# Patient Record
Sex: Male | Born: 1970 | Race: White | Hispanic: No | Marital: Married | Smoking: Never smoker
Health system: Southern US, Community
[De-identification: ages and names within clinical notes are randomized; demographics above are authoritative.]

## PROBLEM LIST (undated history)

## (undated) DIAGNOSIS — S0300XA Dislocation of jaw, unspecified side, initial encounter: Secondary | ICD-10-CM

## (undated) HISTORY — DX: Dislocation of jaw, unspecified side, initial encounter: S03.00XA

## (undated) HISTORY — PX: CHOLECYSTECTOMY: SHX55

---

## 2000-07-25 ENCOUNTER — Encounter: Payer: Self-pay | Admitting: Emergency Medicine

## 2000-07-25 ENCOUNTER — Emergency Department (HOSPITAL_COMMUNITY): Admission: EM | Admit: 2000-07-25 | Discharge: 2000-07-25 | Payer: Self-pay

## 2013-03-22 ENCOUNTER — Ambulatory Visit (INDEPENDENT_AMBULATORY_CARE_PROVIDER_SITE_OTHER): Payer: BC Managed Care – PPO | Admitting: Family Medicine

## 2013-03-22 ENCOUNTER — Ambulatory Visit: Payer: BC Managed Care – PPO

## 2013-03-22 VITALS — BP 139/92 | HR 89 | Temp 98.2°F | Resp 18 | Ht 68.0 in | Wt 324.0 lb

## 2013-03-22 DIAGNOSIS — M25569 Pain in unspecified knee: Secondary | ICD-10-CM

## 2013-03-22 DIAGNOSIS — M25562 Pain in left knee: Secondary | ICD-10-CM

## 2013-03-22 DIAGNOSIS — T148XXA Other injury of unspecified body region, initial encounter: Secondary | ICD-10-CM

## 2013-03-22 MED ORDER — METHOCARBAMOL 500 MG PO TABS: 500.0000 mg | ORAL_TABLET | Freq: Every evening | ORAL | Status: DC | PRN

## 2013-03-22 MED ORDER — OXAPROZIN 600 MG PO TABS: 600.0000 mg | ORAL_TABLET | Freq: Two times a day (BID) | ORAL | Status: DC

## 2013-03-22 MED ORDER — TRAMADOL HCL 50 MG PO TABS: 50.0000 mg | ORAL_TABLET | Freq: Three times a day (TID) | ORAL | Status: DC | PRN

## 2013-03-22 NOTE — Progress Notes (Signed)
[facility]:  Office Visit  Chief Complaint:  Chief Complaint  Patient presents with  . Knee Pain    left knee pain and lower leg-felt pain when mowing yesterday    HPI: Henry Friedman is a 42 y.o. male who complains of  Left knee pain, this actually started  2-3 weeks ago while pulling brush at work. Had sharp pain and hurt the 1st day and hurt a little the next day, then it resolved. However he mowed the lawn yesterday and had sharp left knee pain intermittently, worse with walkign and turning in certain positions. He may have been pivoting on his left leg when he was moving and pushing the lawn mower when this started.Throbbing and pressure pain with prolong sitting, better with ROM . Has left medial pain and pain along inferior knee. He also has sharp pain that shoots up from the medial side up to his anterior knee. Has had no prior knee injuries. He had a chainsaw accident several years ago which was stitched up. Denies weakness, numbness, tingling.   Past Medical History  Diagnosis Date  . TMJ (dislocation of temporomandibular joint)    History reviewed. No pertinent past surgical history. History   Social History  . Marital Status: Married    Spouse Name: N/A    Number of Children: N/A  . Years of Education: N/A   Social History Main Topics  . Smoking status: Never Smoker   . Smokeless tobacco: None  . Alcohol Use: Yes  . Drug Use: No  . Sexually Active: Yes   Other Topics Concern  . None   Social History Narrative  . None   Family History  Problem Relation Age of Onset  . Cancer Maternal Grandfather     lung   No Known Allergies Prior to Admission medications   Not on File     ROS: The patient denies fevers, chills, night sweats, unintentional weight loss, chest pain, palpitations, wheezing, dyspnea on exertion, nausea, vomiting, abdominal pain, dysuria, hematuria, melena, numbness, weakness, or tingling.   All other systems have been  reviewed and were otherwise negative with the exception of those mentioned in the HPI and as above.    PHYSICAL EXAM: Filed Vitals:   03/22/13 1552  BP: 139/92  Pulse: 89  Temp: 98.2 F (36.8 C)  Resp: 18   Filed Vitals:   03/22/13 1552  Height: 5\' 8"  (1.727 m)  Weight: 324 lb (146.965 kg)   Body mass index is 49.28 kg/(m^2).  General: Alert, no acute distress,morbidly obese white male HEENT:  Normocephalic, atraumatic, oropharynx patent.  Cardiovascular:  Regular rate and rhythm, no rubs murmurs or gallops.  No Carotid bruits, radial pulse intact. +pedal edema.  Respiratory: Clear to auscultation bilaterally.  No wheezes, rales, or rhonchi.  No cyanosis, no use of accessory musculature GI: No organomegaly, abdomen is soft and non-tender, positive bowel sounds.  No masses. Skin: No rashes. Neurologic: Facial musculature symmetric. Psychiatric: Patient is appropriate throughout our interaction. Lymphatic: No cervical lymphadenopathy Musculoskeletal: Limping on left knee due to pain. No deformities Old scar from chainsaw + dark spot where foreign body on xray might be located + crepitus,  Minimal effusion No erythema, warmth + joint line tenderness, + pain at [facility] with Valgus stress, stable to varus stress, no appreciable Lachmans Full ROM, 5/5 strength, sensation intact    LABS: No results found for this or any previous visit.   EKG/XRAY:   Primary read interpreted by Dr.  Ori Kreiter at [facility]. Foreign body in left lateral upper knee ( not where his pain is--has had prior knee injury with chainsaw) No acute fx/dislocation + minimal DJD   ASSESSMENT/PLAN: Encounter Diagnoses  Name Primary?  . Acute knee pain, left Yes  . Sprain and strain    Likely MC ligamentous sprain with possible medial meniscus involvement of left knee Patient was given rx for knee brace to be obtained at a [employer] or [facility] since they take insurance.  We were unable to fit him  with a brace at our office due to his body habitus He will determine if he is able to afford the brace Rx Daypro with precautions given, Robaxin and also Tramadol F/u prn    Aruna Nestler PHUONG, DO 03/22/2013 5:07 PM

## 2015-03-10 ENCOUNTER — Ambulatory Visit (INDEPENDENT_AMBULATORY_CARE_PROVIDER_SITE_OTHER): Payer: BLUE CROSS/BLUE SHIELD

## 2015-03-10 ENCOUNTER — Ambulatory Visit (INDEPENDENT_AMBULATORY_CARE_PROVIDER_SITE_OTHER): Payer: BLUE CROSS/BLUE SHIELD | Admitting: Physician Assistant

## 2015-03-10 VITALS — BP 118/80 | HR 107 | Temp 98.1°F | Resp 18 | Ht 68.0 in | Wt 319.0 lb

## 2015-03-10 DIAGNOSIS — I891 Lymphangitis: Secondary | ICD-10-CM

## 2015-03-10 DIAGNOSIS — M7989 Other specified soft tissue disorders: Secondary | ICD-10-CM | POA: Diagnosis not present

## 2015-03-10 LAB — POCT CBC
Granulocyte percent: 69.8 %G (ref 37–80)
HCT, POC: 43.4 % — AB (ref 43.5–53.7)
Hemoglobin: 14 g/dL — AB (ref 14.1–18.1)
Lymph, poc: 2.7 (ref 0.6–3.4)
MCH, POC: 27.6 pg (ref 27–31.2)
MCHC: 92.4 g/dL — AB (ref 31.8–35.4)
MCV: 85.3 fL (ref 80–97)
MID (cbc): 0.8 (ref 0–0.9)
MPV: 6.7 fL (ref 0–99.8)
POC Granulocyte: 8.2 — AB (ref 2–6.9)
POC LYMPH PERCENT: 23.1 %L (ref 10–50)
POC MID %: 7.1 %M (ref 0–12)
Platelet Count, POC: 339 10*3/uL (ref 142–424)
RBC: 5.09 M/uL (ref 4.69–6.13)
RDW, POC: 13.3 %
WBC: 11.7 10*3/uL — AB (ref 4.6–10.2)

## 2015-03-10 LAB — GLUCOSE, POCT (MANUAL RESULT ENTRY): POC Glucose: 101 mg/dl — AB (ref 70–99)

## 2015-03-10 MED ORDER — CEPHALEXIN 500 MG PO CAPS: 500.0000 mg | ORAL_CAPSULE | Freq: Two times a day (BID) | ORAL | Status: AC

## 2015-03-10 NOTE — Patient Instructions (Signed)
Take antibiotic twice a day for 10 days. Rest your right hand the best you can until starting to improve. Take benadryl at night and zyrtec in the morning to help your allergic symptoms. Return in 2 days for follow up.

## 2015-03-10 NOTE — Progress Notes (Signed)
Subjective:    Patient ID: Henry Friedman, male    DOB: [birth year], 44 y.o.   MRN: [id number]  HPI  This is a 44 year old male who is presenting with right hand swelling x 1 day. Reports yesterday he woke and his right 5th digit was itchy. States he scratched that finger a couple times yesterday. This morning he woke scratching his right 5th digits. Immediately after he started to noticed swelling of the medial aspect of his hand. The swelling gradually started spreading up his forearm. Everything is itchy. He has mild pain at his medial wrist. He cannot recall getting an insect bite or being being in contact with outside brush. He went to [place], [place] this weekend but did not do anything out of the ordinary. He took 800 mg ibuprofen this morning without help. He denies fever or chills. He has no known allergies other than poison ivy which he states he is extremely allergic to and this is nothing like an allergic reaction to poison ivy.  Review of Systems  Constitutional: Negative for fever and chills.  Respiratory: Negative for wheezing.   Gastrointestinal: Negative for nausea, vomiting and abdominal pain.  Musculoskeletal: Positive for arthralgias.  Skin: Positive for color change and rash.  Allergic/Immunologic: Negative for environmental allergies.  Neurological: Negative for numbness.  Hematological: Negative for adenopathy.    There are no active problems to display for this patient.  Prior to Admission medications   Medication Sig Start Date End Date Taking? Authorizing Provider  levothyroxine (SYNTHROID, LEVOTHROID) 50 MCG tablet Take 50 mcg by mouth daily before breakfast.   Yes Historical Provider, MD   No Known Allergies  Patient's social and family history were reviewed.     Objective:   Physical Exam  Constitutional: He is oriented to person, place, and time. He appears well-developed and well-nourished. No distress.  HENT:  Head: Normocephalic and atraumatic.    Right Ear: Hearing normal.  Left Ear: Hearing normal.  Nose: Nose normal.  Eyes: Conjunctivae and lids are normal. Right eye exhibits no discharge. Left eye exhibits no discharge. No scleral icterus.  Cardiovascular: Normal rate, regular rhythm, normal heart sounds and normal pulses.   No murmur heard. Pulmonary/Chest: Effort normal and breath sounds normal. No respiratory distress. He has no wheezes. He has no rhonchi. He has no rales.  Musculoskeletal: Normal range of motion.  Neurological: He is alert and oriented to person, place, and time. He has normal strength. No sensory deficit.  Skin: Skin is warm and dry.  Right medial hand/wrist with erythema and swelling. Lymphangitic spread present over ventral forearm. Decreased ROM in hand and wrist d/t swelling. Tenderness only at proximal 5th metacarpal/medial wrist.  Psychiatric: He has a normal mood and affect. His speech is normal and behavior is normal. Thought content normal.   BP 118/80 mmHg  Pulse 107  Temp(Src) 98.1 F (36.7 C) (Oral)  Resp 18  Ht  (1.727 m)  Wt 319 lb (144.697 kg)  BMI 48.51 kg/m2  SpO2 97%  Results for orders placed or performed in visit on 03/10/15  POCT CBC  Result Value Ref Range   WBC 11.7 (A) 4.6 - 10.2 K/uL   Lymph, poc 2.7 0.6 - 3.4   POC LYMPH PERCENT 23.1 10 - 50 %L   MID (cbc) 0.8 0 - 0.9   POC MID % 7.1 0 - 12 %M   POC Granulocyte 8.2 (A) 2 - 6.9   Granulocyte percent 69.8 37 -  80 %G   RBC 5.09 4.69 - 6.13 M/uL   Hemoglobin 14.0 (A) 14.1 - 18.1 g/dL   HCT, POC 16.143.4 (A) 09.643.5 - 53.7 %   MCV 85.3 80 - 97 fL   MCH, POC 27.6 27 - 31.2 pg   MCHC 92.4 (A) 31.8 - 35.4 g/dL   RDW, POC 04.513.3 %   Platelet Count, POC 339 142 - 424 K/uL   MPV 6.7 0 - 99.8 fL  POCT glucose (manual entry)  Result Value Ref Range   POC Glucose 101 (A) 70 - 99 mg/dl   [facility] reading (PRIMARY) by  Dr. Merla Richesoolittle: negative     Assessment & Plan:  1. Swelling of right hand 2. Lymphangitis Radiographs negative.  D/t lymphangitic spread will treat with keflex. For allergic symptoms he will take benadryl at night and zyrtec during the day for the next 2-3 days. He will return for follow up in 2 days to make sure he is heading in the right direction.  - Uric Acid - POCT CBC - POCT glucose (manual entry) - DG Hand Complete Right; Future - DG Wrist Complete Right; Future - cephALEXin (KEFLEX) 500 MG capsule; Take 1 capsule (500 mg total) by mouth 2 (two) times daily.  Dispense: 20 capsule; Refill: 0   Roswell MinersNicole V. Dyke BrackettBush, PA-C, MHS [facility]  03/11/2015

## 2015-03-11 LAB — URIC ACID: Uric Acid, Serum: 7.8 mg/dL (ref 4.0–7.8)

## 2015-03-12 ENCOUNTER — Ambulatory Visit (INDEPENDENT_AMBULATORY_CARE_PROVIDER_SITE_OTHER): Payer: BLUE CROSS/BLUE SHIELD | Admitting: Physician Assistant

## 2015-03-12 VITALS — BP 120/84 | HR 104 | Temp 97.9°F | Resp 16 | Ht 68.0 in | Wt 319.0 lb

## 2015-03-12 DIAGNOSIS — M7989 Other specified soft tissue disorders: Secondary | ICD-10-CM

## 2015-03-12 DIAGNOSIS — I891 Lymphangitis: Secondary | ICD-10-CM | POA: Diagnosis not present

## 2015-03-12 DIAGNOSIS — E039 Hypothyroidism, unspecified: Secondary | ICD-10-CM | POA: Insufficient documentation

## 2015-03-12 NOTE — Progress Notes (Signed)
   Subjective:    Patient ID: Henry Friedman, male    DOB: 10/26/1971, 44 y.o.   MRN: [id number]  HPI  This is a 44 year old male who is presenting for follow up right hand swelling and lymphangitis. I saw pt 2 days ago with medial hand swelling and lymphangitis of volar forearm. He had itching and mild pain. Radiographs of his wrist and hand were negative. WBC was 11.7 with left shift. He was placed on keflex for strep coverage and instructed to take benadryl at night and claritin in the AM for allergic symptoms. Today he is doing much better. Swelling has almost completely resolved and no longer has signs of lymphangitis. No longer has pruritus. He denies fever or chills.  Review of Systems  Constitutional: Negative for fever and chills.  Gastrointestinal: Negative for nausea and vomiting.  Musculoskeletal: Negative for arthralgias.  Skin: Positive for color change. Negative for rash.  Neurological: Negative for numbness.  Hematological: Negative for adenopathy.   Patient Active Problem List   Diagnosis Date Noted  . Hypothyroidism 03/12/2015   Prior to Admission medications   Medication Sig Start Date End Date Taking? Authorizing Provider  cephALEXin (KEFLEX) 500 MG capsule Take 1 capsule (500 mg total) by mouth 2 (two) times daily. 03/10/15 03/20/15 Yes Lanier ClamNicole Bush V, PA-C  diphenhydrAMINE (BENADRYL) 25 MG tablet Take 25 mg by mouth every 6 (six) hours as needed.   Yes Historical Provider, MD  levothyroxine (SYNTHROID, LEVOTHROID) 50 MCG tablet Take 50 mcg by mouth daily before breakfast.   Yes Historical Provider, MD  loratadine (CLARITIN) 10 MG tablet Take 10 mg by mouth daily.   Yes Historical Provider, MD   No Known Allergies  Patient's social and family history were reviewed.     Objective:   Physical Exam  Constitutional: He is oriented to person, place, and time. He appears well-developed and well-nourished. No distress.  HENT:  Head: Normocephalic and atraumatic.  Right  Ear: Hearing normal.  Left Ear: Hearing normal.  Nose: Nose normal.  Eyes: Conjunctivae and lids are normal. Right eye exhibits no discharge. Left eye exhibits no discharge. No scleral icterus.  Pulmonary/Chest: Effort normal. No respiratory distress.  Musculoskeletal: Normal range of motion.  Neurological: He is alert and oriented to person, place, and time.  Skin: Skin is warm, dry and intact.  Mild erythema at medial wrist. Swelling has almost completely resolved. No signs of lymphangitis.   Psychiatric: He has a normal mood and affect. His speech is normal and behavior is normal. Thought content normal.   BP 120/84 mmHg  Pulse 104  Temp(Src) 97.9 F (36.6 C) (Oral)  Resp 16  Ht 5\' 8"  (1.727 m)  Wt 319 lb (144.697 kg)  BMI 48.51 kg/m2  SpO2 97%     Assessment & Plan:  1. Lymphangitis 2. Swelling of right hand Significant improvement on keflex. He may stop benadryl at night but would continue claritin daily for next week. He will return if symptoms worsen.   Roswell MinersNicole V. Dyke BrackettBush, PA-C, MHS [facility]  03/12/2015

## 2016-01-14 IMAGING — CR DG HAND COMPLETE 3+V*R*
3 series · 3 of 3 positions shown · non-contrast
Comparison: None.

CLINICAL DATA: Swelling of right hand.

EXAM:
RIGHT HAND - COMPLETE 3+ VIEW

[PA]
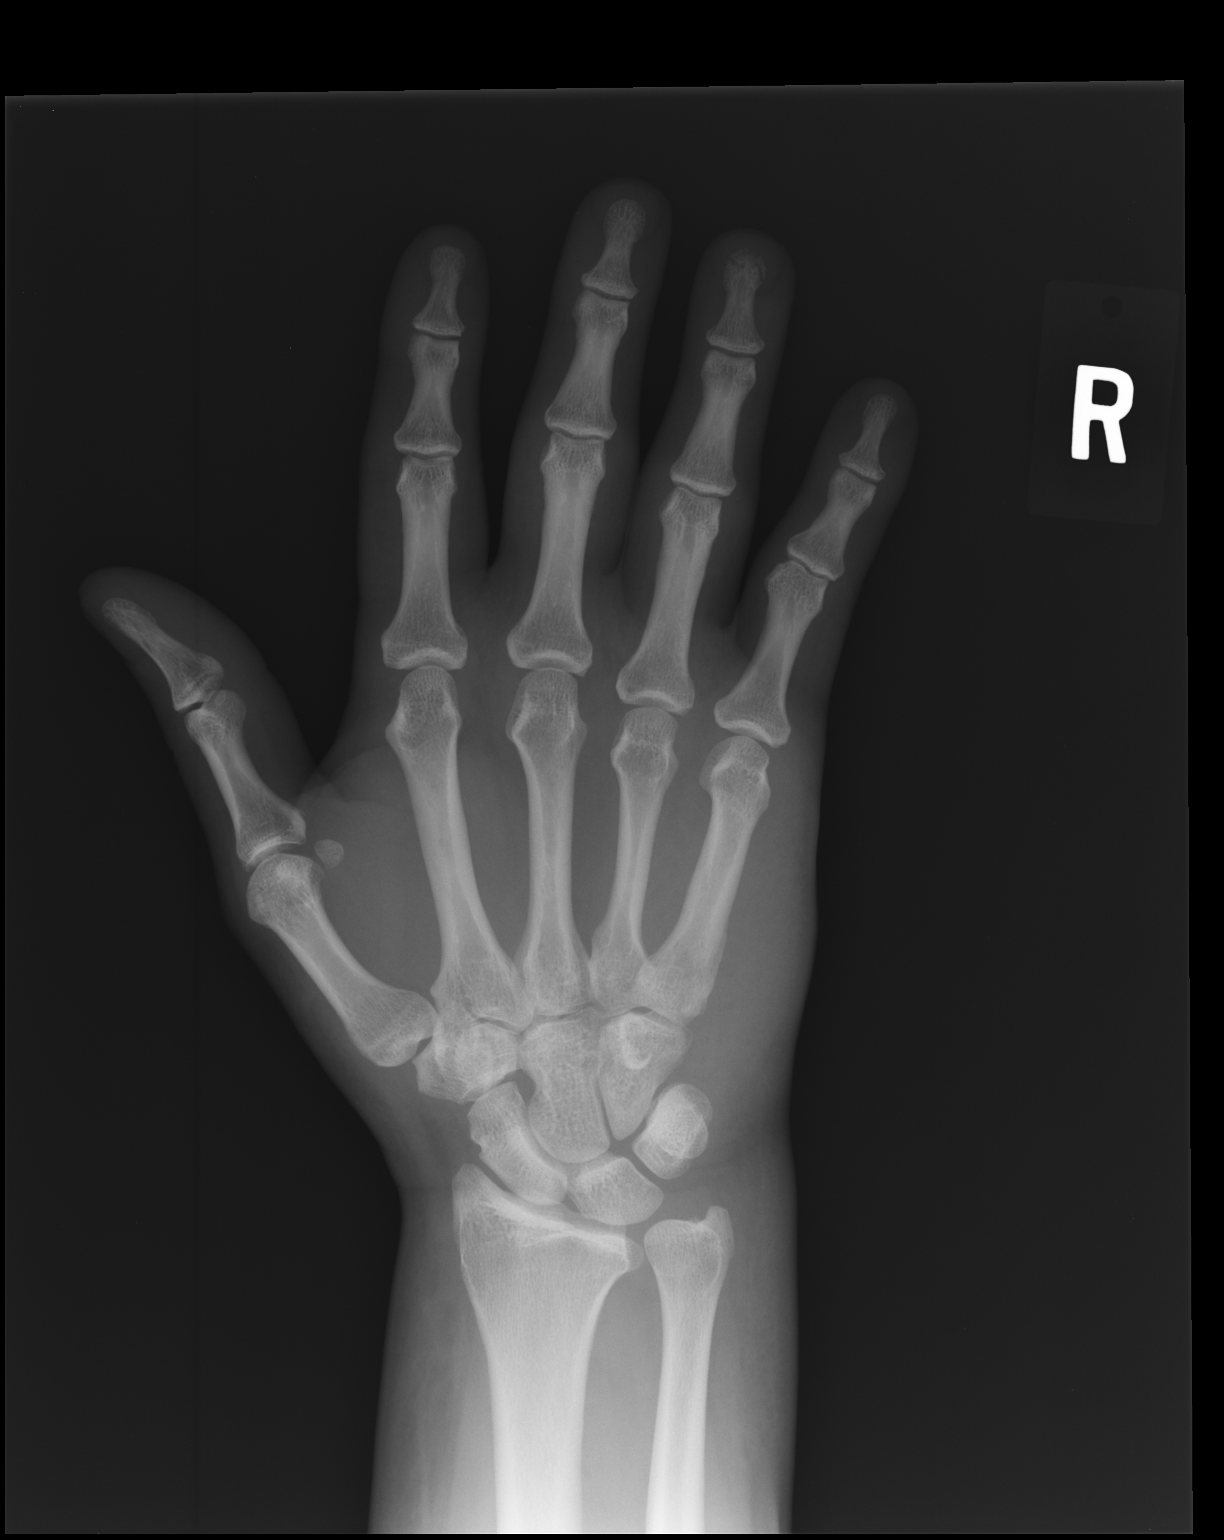

[lateral]
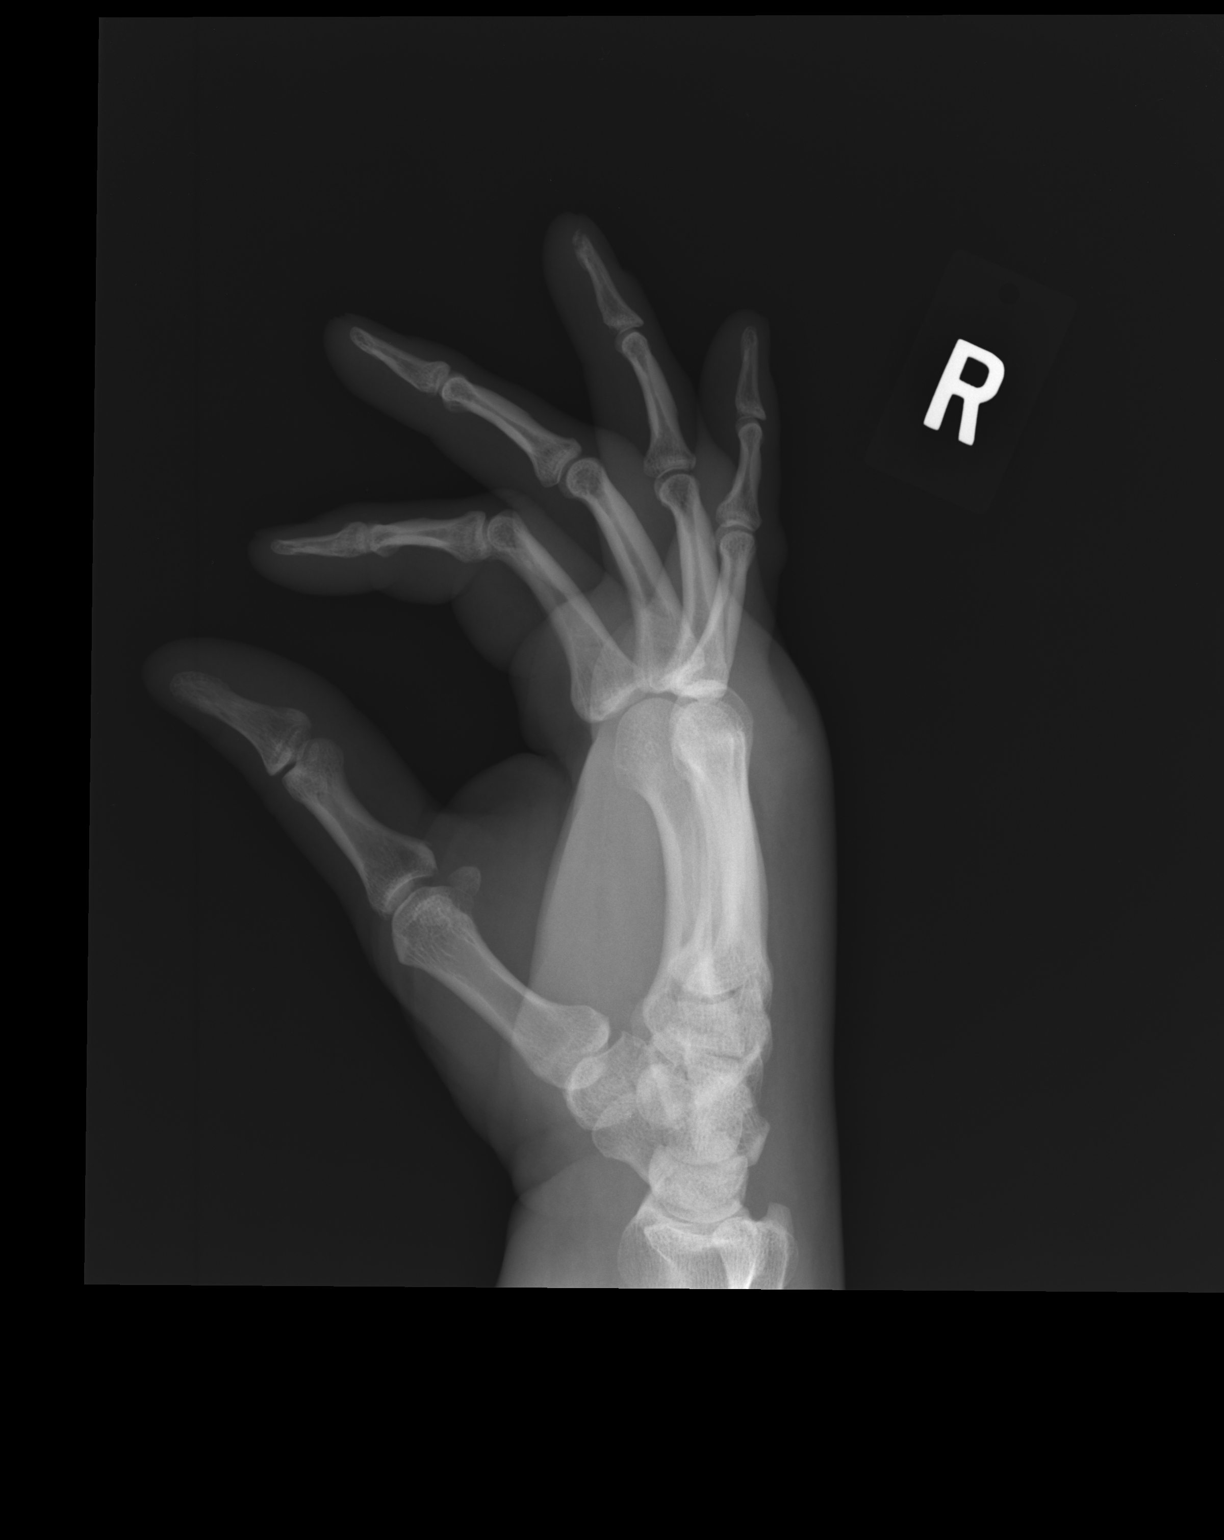

[pa obl]
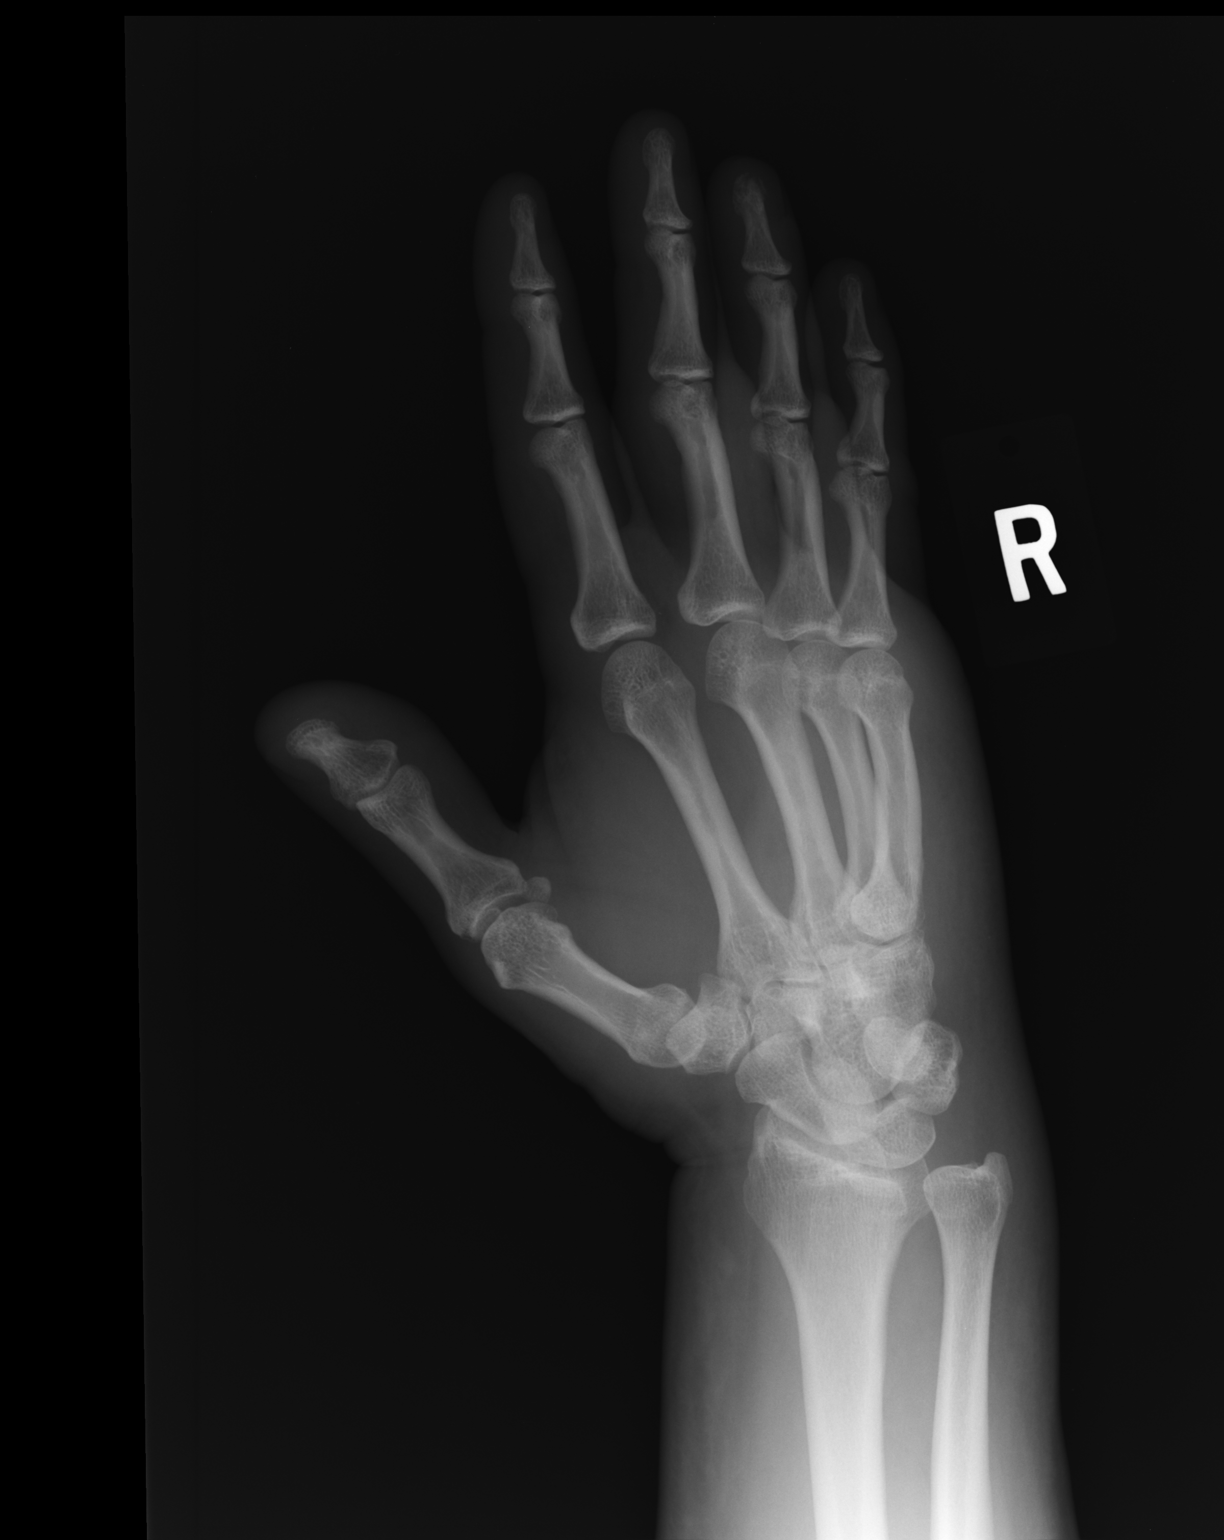

[3 of 3 positions shown; findings below may reference images not displayed]

FINDINGS: Soft tissue swelling over the metacarpal and MCP region. No
underlying bony abnormality. No fracture, subluxation or
dislocation. Joint spaces are maintained.
IMPRESSION: No acute bony abnormality.

## 2016-01-14 IMAGING — CR DG WRIST COMPLETE 3+V*R*
4 series · 4 of 4 positions shown · non-contrast
Comparison: Hand films, dictated separately.

CLINICAL DATA: Right hand swelling.

EXAM:
RIGHT WRIST - COMPLETE 3+ VIEW

[PA]
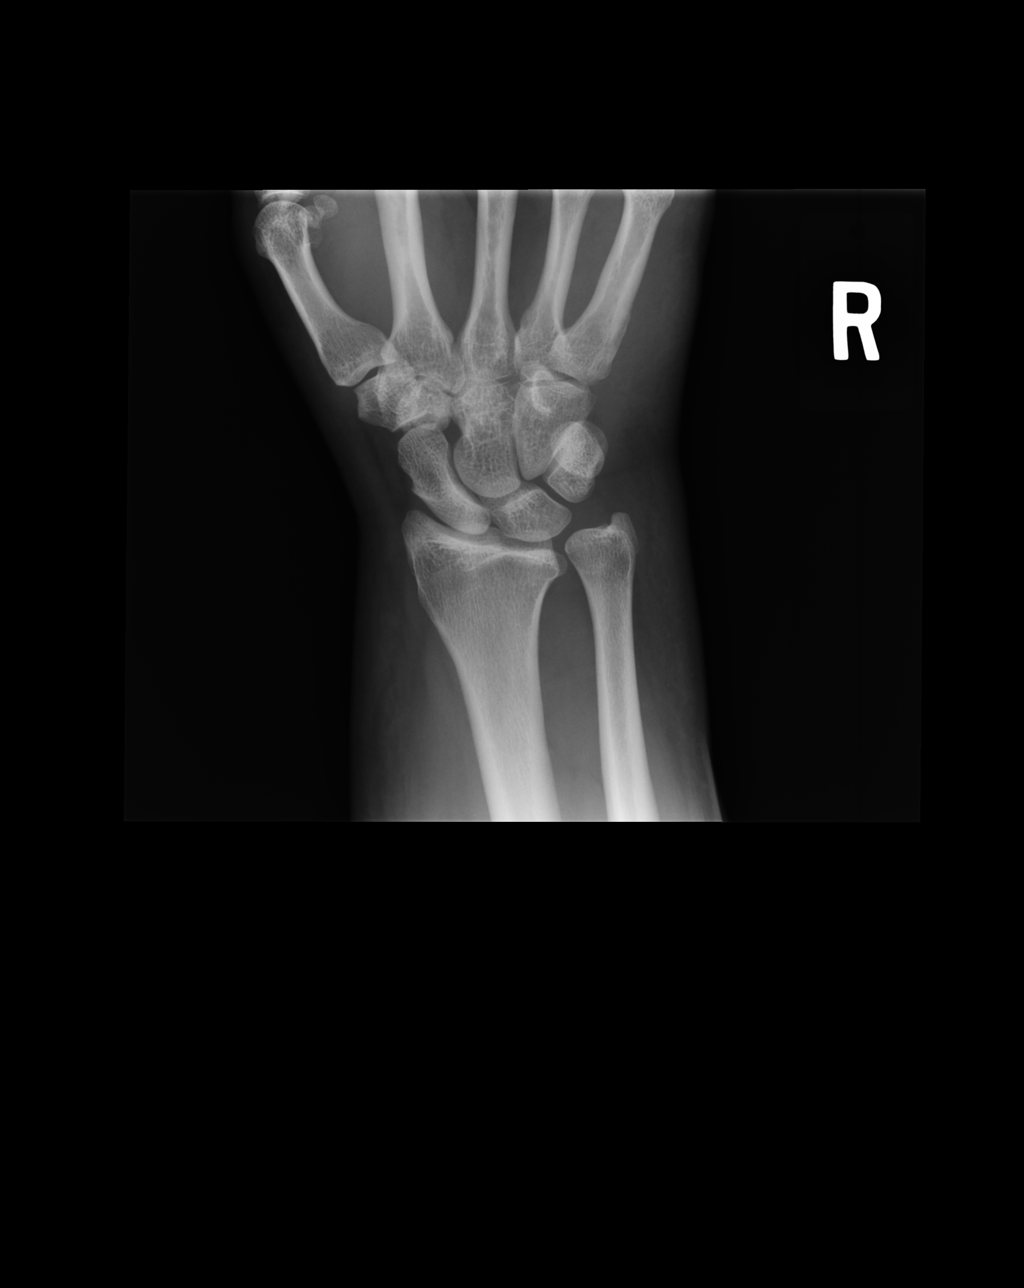

[pa int rot]
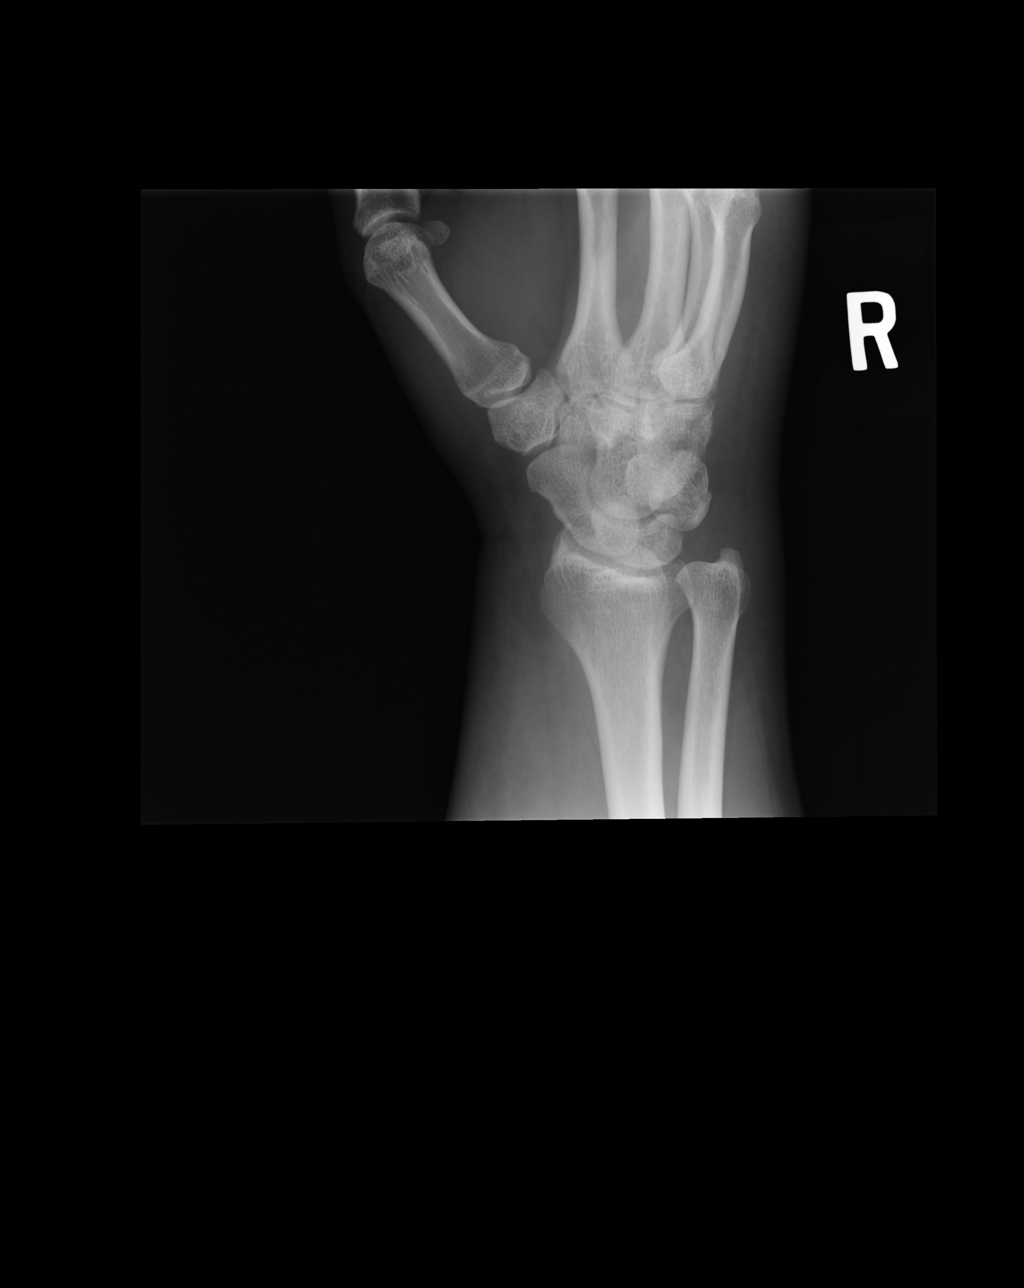

[lateral]
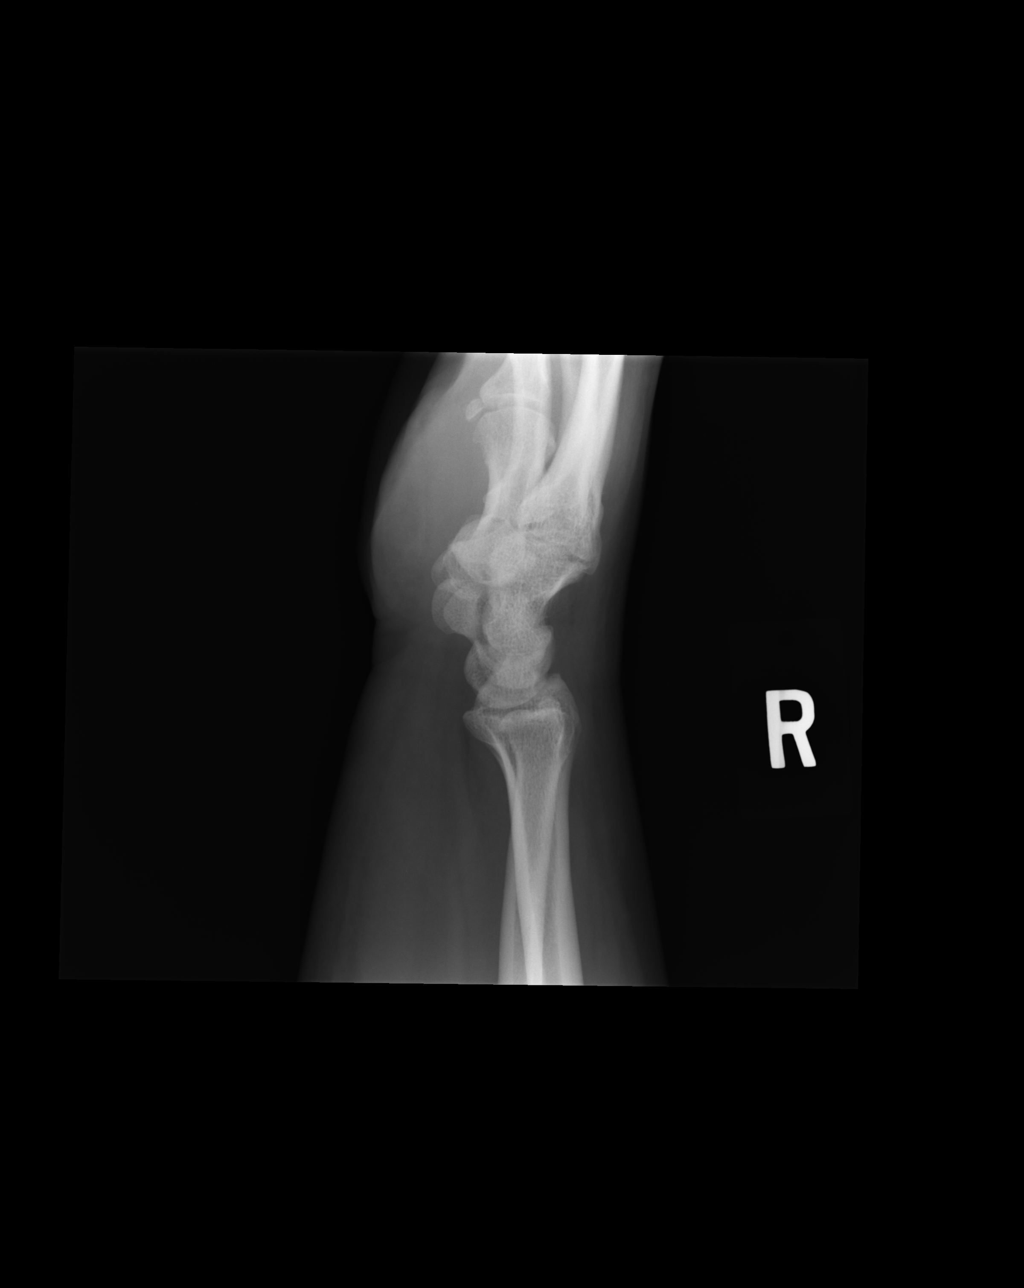

[ap ext rot]
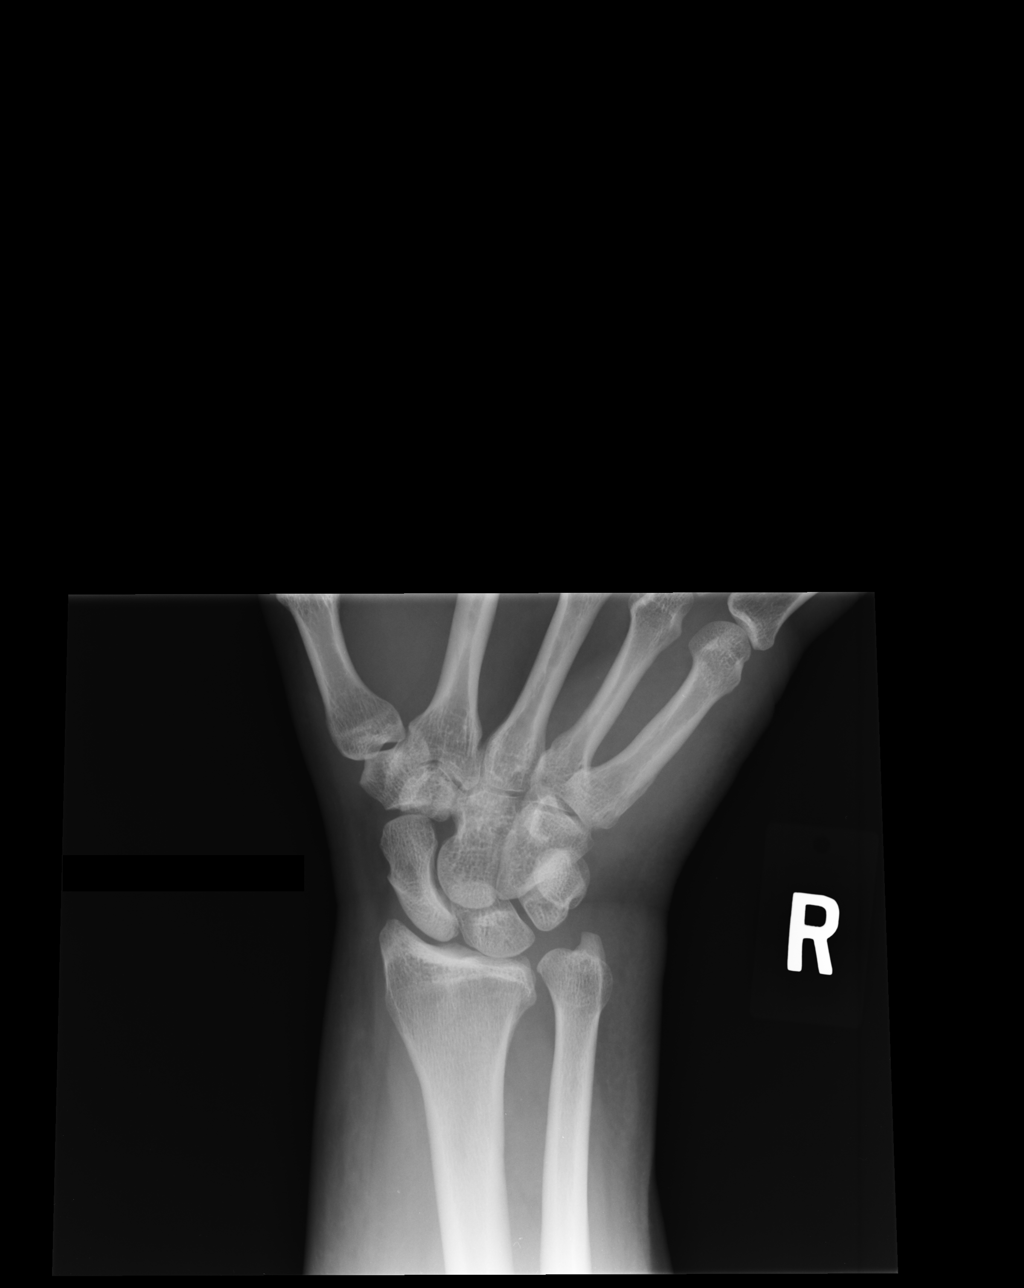

[4 of 4 positions shown; findings below may reference images not displayed]

FINDINGS: No acute fracture or dislocation. Scaphoid intact. Joint spaces
maintained.
IMPRESSION: No acute osseous abnormality.

## 2016-02-03 ENCOUNTER — Ambulatory Visit: Payer: BLUE CROSS/BLUE SHIELD

## 2016-02-03 ENCOUNTER — Ambulatory Visit (INDEPENDENT_AMBULATORY_CARE_PROVIDER_SITE_OTHER): Payer: BLUE CROSS/BLUE SHIELD | Admitting: Physician Assistant

## 2016-02-03 VITALS — BP 128/70 | HR 93 | Temp 97.9°F | Resp 17 | Ht 69.0 in | Wt 322.0 lb

## 2016-02-03 DIAGNOSIS — J988 Other specified respiratory disorders: Secondary | ICD-10-CM | POA: Diagnosis not present

## 2016-02-03 DIAGNOSIS — R05 Cough: Secondary | ICD-10-CM

## 2016-02-03 DIAGNOSIS — R058 Other specified cough: Secondary | ICD-10-CM

## 2016-02-03 DIAGNOSIS — J22 Unspecified acute lower respiratory infection: Secondary | ICD-10-CM

## 2016-02-03 MED ORDER — AZITHROMYCIN 250 MG PO TABS: ORAL_TABLET | ORAL | Status: DC

## 2016-02-03 MED ORDER — HYDROCOD POLST-CPM POLST ER 10-8 MG/5ML PO SUER: 5.0000 mL | Freq: Every evening | ORAL | Status: DC | PRN

## 2016-02-03 MED ORDER — GUAIFENESIN ER 1200 MG PO TB12: 1.0000 | ORAL_TABLET | Freq: Two times a day (BID) | ORAL | Status: DC | PRN

## 2016-02-03 NOTE — Patient Instructions (Signed)
Please hydrate well with 64 oz of water per day, if not more. I would like you return if there is no improvement within the next week, sooner if you develop increased difficulty with breathing, uncontrolled fever, or nausea.   Community-Acquired Pneumonia, Adult Pneumonia is an infection of the lungs. There are different types of pneumonia. One type can develop while a person is in a hospital. A different type, called community-acquired pneumonia, develops in people who are not, or have not recently been, in the hospital or other health care facility.  CAUSES Pneumonia may be caused by bacteria, viruses, or funguses. Community-acquired pneumonia is often caused by Streptococcus pneumonia bacteria. These bacteria are often passed from one person to another by breathing in droplets from the cough or sneeze of an infected person. RISK FACTORS The condition is more likely to develop in:  People who havechronic diseases, such as chronic obstructive pulmonary disease (COPD), asthma, congestive heart failure, cystic fibrosis, diabetes, or kidney disease.  People who haveearly-stage or late-stage HIV.  People who havesickle cell disease.  People who havehad their spleen removed (splenectomy).  People who havepoor Administrator.  People who havemedical conditions that increase the risk of breathing in (aspirating) secretions their own mouth and nose.   People who havea weakened immune system (immunocompromised).  People who smoke.  People whotravel to areas where pneumonia-causing germs commonly exist.  People whoare around animal habitats or animals that have pneumonia-causing germs, including birds, bats, rabbits, cats, and farm animals. SYMPTOMS Symptoms of this condition include:  Adry cough.  A wet (productive) cough.  Fever.  Sweating.  Chest pain, especially when breathing deeply or coughing.  Rapid breathing or difficulty breathing.  Shortness of  breath.  Shaking chills.  Fatigue.  Muscle aches. DIAGNOSIS Your health care provider will take a medical history and perform a physical exam. You may also have other tests, including:  Imaging studies of your chest, including X-rays.  Tests to check your blood oxygen level and other blood gases.  Other tests on blood, mucus (sputum), fluid around your lungs (pleural fluid), and urine. If your pneumonia is severe, other tests may be done to identify the specific cause of your illness. TREATMENT The type of treatment that you receive depends on many factors, such as the cause of your pneumonia, the medicines you take, and other medical conditions that you have. For most adults, treatment and recovery from pneumonia may occur at home. In some cases, treatment must happen in a hospital. Treatment may include:  Antibiotic medicines, if the pneumonia was caused by bacteria.  Antiviral medicines, if the pneumonia was caused by a virus.  Medicines that are given by mouth or through an IV tube.  Oxygen.  Respiratory therapy. Although rare, treating severe pneumonia may include:  Mechanical ventilation. This is done if you are not breathing well on your own and you cannot maintain a safe blood oxygen level.  Thoracentesis. This procedureremoves fluid around one lung or both lungs to help you breathe better. HOME CARE INSTRUCTIONS  Take over-the-counter and prescription medicines only as told by your health care provider.  Only takecough medicine if you are losing sleep. Understand that cough medicine can prevent your body's natural ability to remove mucus from your lungs.  If you were prescribed an antibiotic medicine, take it as told by your health care provider. Do not stop taking the antibiotic even if you start to feel better.  Sleep in a semi-upright position at night. Try sleeping in  a reclining chair, or place a few pillows under your head.  Do not use tobacco products,  including cigarettes, chewing tobacco, and e-cigarettes. If you need help quitting, ask your health care provider.  Drink enough water to keep your urine clear or pale yellow. This will help to thin out mucus secretions in your lungs. PREVENTION There are ways that you can decrease your risk of developing community-acquired pneumonia. Consider getting a pneumococcal vaccine if:  You are older than 47 years of age.  You are older than 47 years of age and are undergoing cancer treatment, have chronic lung disease, or have other medical conditions that affect your immune system. Ask your health care provider if this applies to you. There are different types and schedules of pneumococcal vaccines. Ask your health care provider which vaccination option is best for you. You may also prevent community-acquired pneumonia if you take these actions:  Get an influenza vaccine every year. Ask your health care provider which type of influenza vaccine is best for you.  Go to the dentist on a regular basis.  Wash your hands often. Use hand sanitizer if soap and water are not available. SEEK MEDICAL CARE IF:  You have a fever.  You are losing sleep because you cannot control your cough with cough medicine. SEEK IMMEDIATE MEDICAL CARE IF:  You have worsening shortness of breath.  You have increased chest pain.  Your sickness becomes worse, especially if you are an older adult or have a weakened immune system.  You cough up blood.   This information is not intended to replace advice given to you by your health care provider. Make sure you discuss any questions you have with your health care provider.   Document Released: 11/14/2005 Document Revised: 08/05/2015 Document Reviewed: 03/11/2015 Elsevier Interactive Patient Education [employer]2016 Elsevier Inc.

## 2016-02-03 NOTE — Progress Notes (Signed)
[facility] [address], [place] [id number] [phone]  Date:  02/03/2016   Name:  Henry Friedman   DOB:  10/02/1971   MRN:  [id number]  PCP:  No PCP Per Patient   Chief Complaint  Patient presents with  . URI  . Shortness of Breath  . Cough    History of Present Illness:  Henry Friedman is a 45 y.o. male patient [facility]     2 days ago, fever chills bodyaches.  Chest tightness, coughing up greenish yellow sputum.  Last night, temperature was 100.  Cough aggravated by moving a lot.  He is a non-smoker.  No nasal congesiton, no ear pain.  He has taken day qquil liquid which helped him to sleep.      Patient Active Problem List   Diagnosis Date Noted  . Hypothyroidism 03/12/2015    Past Medical History  Diagnosis Date  . TMJ (dislocation of temporomandibular joint)     Past Surgical History  Procedure Laterality Date  . Cholecystectomy      Social History  Substance Use Topics  . Smoking status: Never Smoker   . Smokeless tobacco: Not on file  . Alcohol Use: Yes    Family History  Problem Relation Age of Onset  . Cancer Maternal Grandfather     lung    No Known Allergies  Medication list has been reviewed and updated.  Current Outpatient Prescriptions on File Prior to Visit  Medication Sig Dispense Refill  . levothyroxine (SYNTHROID, LEVOTHROID) 50 MCG tablet Take 50 mcg by mouth daily before breakfast.     No current facility-administered medications on file prior to visit.    ROS ROS otherwise unremarkable unless listed above.   Physical Examination: BP 128/70 mmHg  Pulse 93  Temp(Src) 97.9 F (36.6 C) (Oral)  Resp 17  Ht 5\' 9"  (1.753 m)  Wt 322 lb (146.058 kg)  BMI 47.53 kg/m2  SpO2 97% Ideal Body Weight: Weight in (lb) to have BMI = 25: 168.9  Physical Exam  Constitutional: He is oriented to person, place, and time. He appears well-developed and well-nourished. No distress.  HENT:   Head: Atraumatic.  Right Ear: Tympanic membrane, external ear and ear canal normal.  Left Ear: Tympanic membrane, external ear and ear canal normal.  Nose: Mucosal edema and rhinorrhea present. Right sinus exhibits no maxillary sinus tenderness and no frontal sinus tenderness. Left sinus exhibits no maxillary sinus tenderness and no frontal sinus tenderness.  Mouth/Throat: No uvula swelling. No oropharyngeal exudate, posterior oropharyngeal edema or posterior oropharyngeal erythema.  Eyes: Conjunctivae, EOM and lids are normal. Pupils are equal, round, and reactive to light. Right eye exhibits normal extraocular motion. Left eye exhibits normal extraocular motion.  Neck: Trachea normal and full passive range of motion without pain. No edema and no erythema present.  Cardiovascular: Normal rate.   Pulmonary/Chest: Effort normal. No respiratory distress. He has no decreased breath sounds. He has no wheezes. He has rhonchi.  Neurological: He is alert and oriented to person, place, and time.  Skin: Skin is warm and dry. He is not diaphoretic.  Psychiatric: He has a normal mood and affect. His behavior is normal.     Assessment and Plan: Henry Friedman is a 45 y.o. male who is here today for cc of cough, congestion, and sob.  Lower respiratory infection (e.g., bronchitis, pneumonia, pneumonitis, pulmonitis) - Plan: Guaifenesin (MUCINEX MAXIMUM STRENGTH) 1200 MG TB12,  chlorpheniramine-HYDROcodone (TUSSIONEX PENNKINETIC ER) 10-8 MG/5ML SUER  Productive cough - Plan: azithromycin (ZITHROMAX) 250 MG tablet, Guaifenesin (MUCINEX MAXIMUM STRENGTH) 1200 MG TB12, chlorpheniramine-HYDROcodone (TUSSIONEX PENNKINETIC ER) 10-8 MG/5ML SUER, CANCELED: DG Chest 2 View   Trena Platt, PA-C [facility] 02/03/2016 11:07 AM

## 2016-02-14 ENCOUNTER — Encounter: Payer: Self-pay | Admitting: Physician Assistant

## 2017-12-28 ENCOUNTER — Other Ambulatory Visit: Payer: Self-pay

## 2017-12-28 ENCOUNTER — Ambulatory Visit: Payer: BLUE CROSS/BLUE SHIELD | Admitting: Physician Assistant

## 2017-12-28 VITALS — BP 114/76 | HR 94 | Temp 98.1°F | Resp 16 | Ht 69.0 in | Wt 341.0 lb

## 2017-12-28 DIAGNOSIS — J22 Unspecified acute lower respiratory infection: Secondary | ICD-10-CM

## 2017-12-28 MED ORDER — GUAIFENESIN ER 1200 MG PO TB12: 1.0000 | ORAL_TABLET | Freq: Two times a day (BID) | ORAL | 1 refills | Status: AC | PRN

## 2017-12-28 MED ORDER — BENZONATATE 100 MG PO CAPS: 100.0000 mg | ORAL_CAPSULE | Freq: Three times a day (TID) | ORAL | 0 refills | Status: AC | PRN

## 2017-12-28 MED ORDER — AZITHROMYCIN 250 MG PO TABS: ORAL_TABLET | ORAL | 0 refills | Status: AC

## 2017-12-28 NOTE — Patient Instructions (Addendum)
Please make sure you are hydrating well with 64 oz of water.  Please take the medications as prescribed.  Please return if you develop shortness of breath, trouble breathing, uncontrolled fever, not tolerating the medication, or dizziness.    IF you received an x-ray today, you will receive an invoice from [facility]. Please contact [facility] at [phone] with questions or concerns regarding your invoice.   IF you received labwork today, you will receive an invoice from [facility]. Please contact [facility] at [phone] with questions or concerns regarding your invoice.   Our billing staff will not be able to assist you with questions regarding bills from these companies.  You will be contacted with the lab results as soon as they are available. The fastest way to get your results is to activate your My Chart account. Instructions are located on the last page of this paperwork. If you have not heard from us regarding the results in 2 weeks, please contact this office.

## 2017-12-28 NOTE — Progress Notes (Signed)
[facility] [address], [place] [phone]  Date:  12/28/2017   Name:  Henry Friedman   DOB:  09/16/1971   MRN:  [id number]  PCP:  Patient, No Pcp Per    History of Present Illness:  Henry Friedman is a 47 y.o. male patient who presents to PCP with  Chief Complaint  Patient presents with  . Cough    green/white mucusx 1wk  . Sinusitis    x 1wk/headache     1 week ago Whitish green mucus, but not so much.  The cough causes rib pain.  He does have facial pain at his cheeks.  No runny nose.  No ear discomfort.  No sore thraot.  Malaise and fatigue.   Cold chills  Last night, the worse with nasal congestion and chest tightness.   He has been taking mucinex, which helps.    Mat CarneEdward williamson past physician..  Patient Active Problem List   Diagnosis Date Noted  . Hypothyroidism 03/12/2015    Past Medical History:  Diagnosis Date  . TMJ (dislocation of temporomandibular joint)     Past Surgical History:  Procedure Laterality Date  . CHOLECYSTECTOMY      Social History   Tobacco Use  . Smoking status: Never Smoker  Substance Use Topics  . Alcohol use: Yes  . Drug use: No    Family History  Problem Relation Age of Onset  . Cancer Maternal Grandfather        lung    No Known Allergies  Medication list has been reviewed and updated.  Current Outpatient Medications on File Prior to Visit  Medication Sig Dispense Refill  . levothyroxine (SYNTHROID, LEVOTHROID) 50 MCG tablet Take 50 mcg by mouth daily before breakfast.    . azithromycin (ZITHROMAX) 250 MG tablet Take 2 tabs PO x 1 dose, then 1 tab PO QD x 4 days (Patient not taking: Reported on 12/28/2017) 6 tablet 0  . chlorpheniramine-HYDROcodone (TUSSIONEX PENNKINETIC ER) 10-8 MG/5ML SUER Take 5 mLs by mouth at bedtime as needed. (Patient not taking: Reported on 12/28/2017) 60 mL 0  . Guaifenesin (MUCINEX MAXIMUM STRENGTH) 1200 MG TB12 Take 1 tablet (1,200 mg total) by mouth every 12  (twelve) hours as needed. (Patient not taking: Reported on 12/28/2017) 14 tablet 1   No current facility-administered medications on file prior to visit.     ROS ROS otherwise unremarkable unless listed above.  Physical Examination: Pulse 94   Temp 98.1 F (36.7 C) (Oral)   Resp 16   Ht 5\' 9"  (1.753 m)   Wt (!) 341 lb (154.7 kg)   SpO2 95%   BMI 50.36 kg/m  Ideal Body Weight: Weight in (lb) to have BMI = 25: 168.9  Physical Exam  Constitutional: He is oriented to person, place, and time. He appears well-developed and well-nourished. No distress.  HENT:  Head: Normocephalic and atraumatic.  Right Ear: Tympanic membrane, external ear and ear canal normal.  Left Ear: Tympanic membrane, external ear and ear canal normal.  Nose: Mucosal edema and rhinorrhea present. Right sinus exhibits no maxillary sinus tenderness and no frontal sinus tenderness. Left sinus exhibits no maxillary sinus tenderness and no frontal sinus tenderness.  Mouth/Throat: No uvula swelling. No oropharyngeal exudate, posterior oropharyngeal edema or posterior oropharyngeal erythema.  Eyes: Conjunctivae, EOM and lids are normal. Pupils are equal, round, and reactive to light. Right eye exhibits normal extraocular motion. Left eye exhibits normal extraocular motion.  Neck: Trachea normal and  full passive range of motion without pain. No edema and no erythema present.  Cardiovascular: Normal rate.  Pulmonary/Chest: Effort normal. No respiratory distress. He has no decreased breath sounds. He has no wheezes. He has no rhonchi.  Neurological: He is alert and oriented to person, place, and time.  Skin: Skin is warm and dry. He is not diaphoretic.  Psychiatric: He has a normal mood and affect. His behavior is normal.     Assessment and Plan: Henry Friedman is a 47 y.o. male who is here today for cc of  Chief Complaint  Patient presents with  . Cough    green/white mucusx 1wk  . Sinusitis    x 1wk/headache    Lower respiratory infection (e.g., bronchitis, pneumonia, pneumonitis, pulmonitis)  Trena Platt, PA-C [facility] 1/31/20195:39 PM

## 2018-01-07 ENCOUNTER — Encounter: Payer: Self-pay | Admitting: Physician Assistant

## 2018-02-28 ENCOUNTER — Encounter: Payer: Self-pay | Admitting: Physician Assistant
